# Patient Record
Sex: Male | Born: 2000 | Race: White | Hispanic: No | Marital: Single | Smoking: Never smoker
Health system: Southern US, Community
[De-identification: ages and names within clinical notes are randomized; demographics above are authoritative.]

---
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
## 2022-03-13 IMAGING — CR DG CHEST 2V
2 series · 2 of 2 positions shown · non-contrast
Comparison: None.

CLINICAL DATA: Chest pain

EXAM:
CHEST - 2 VIEW

[chest pa]
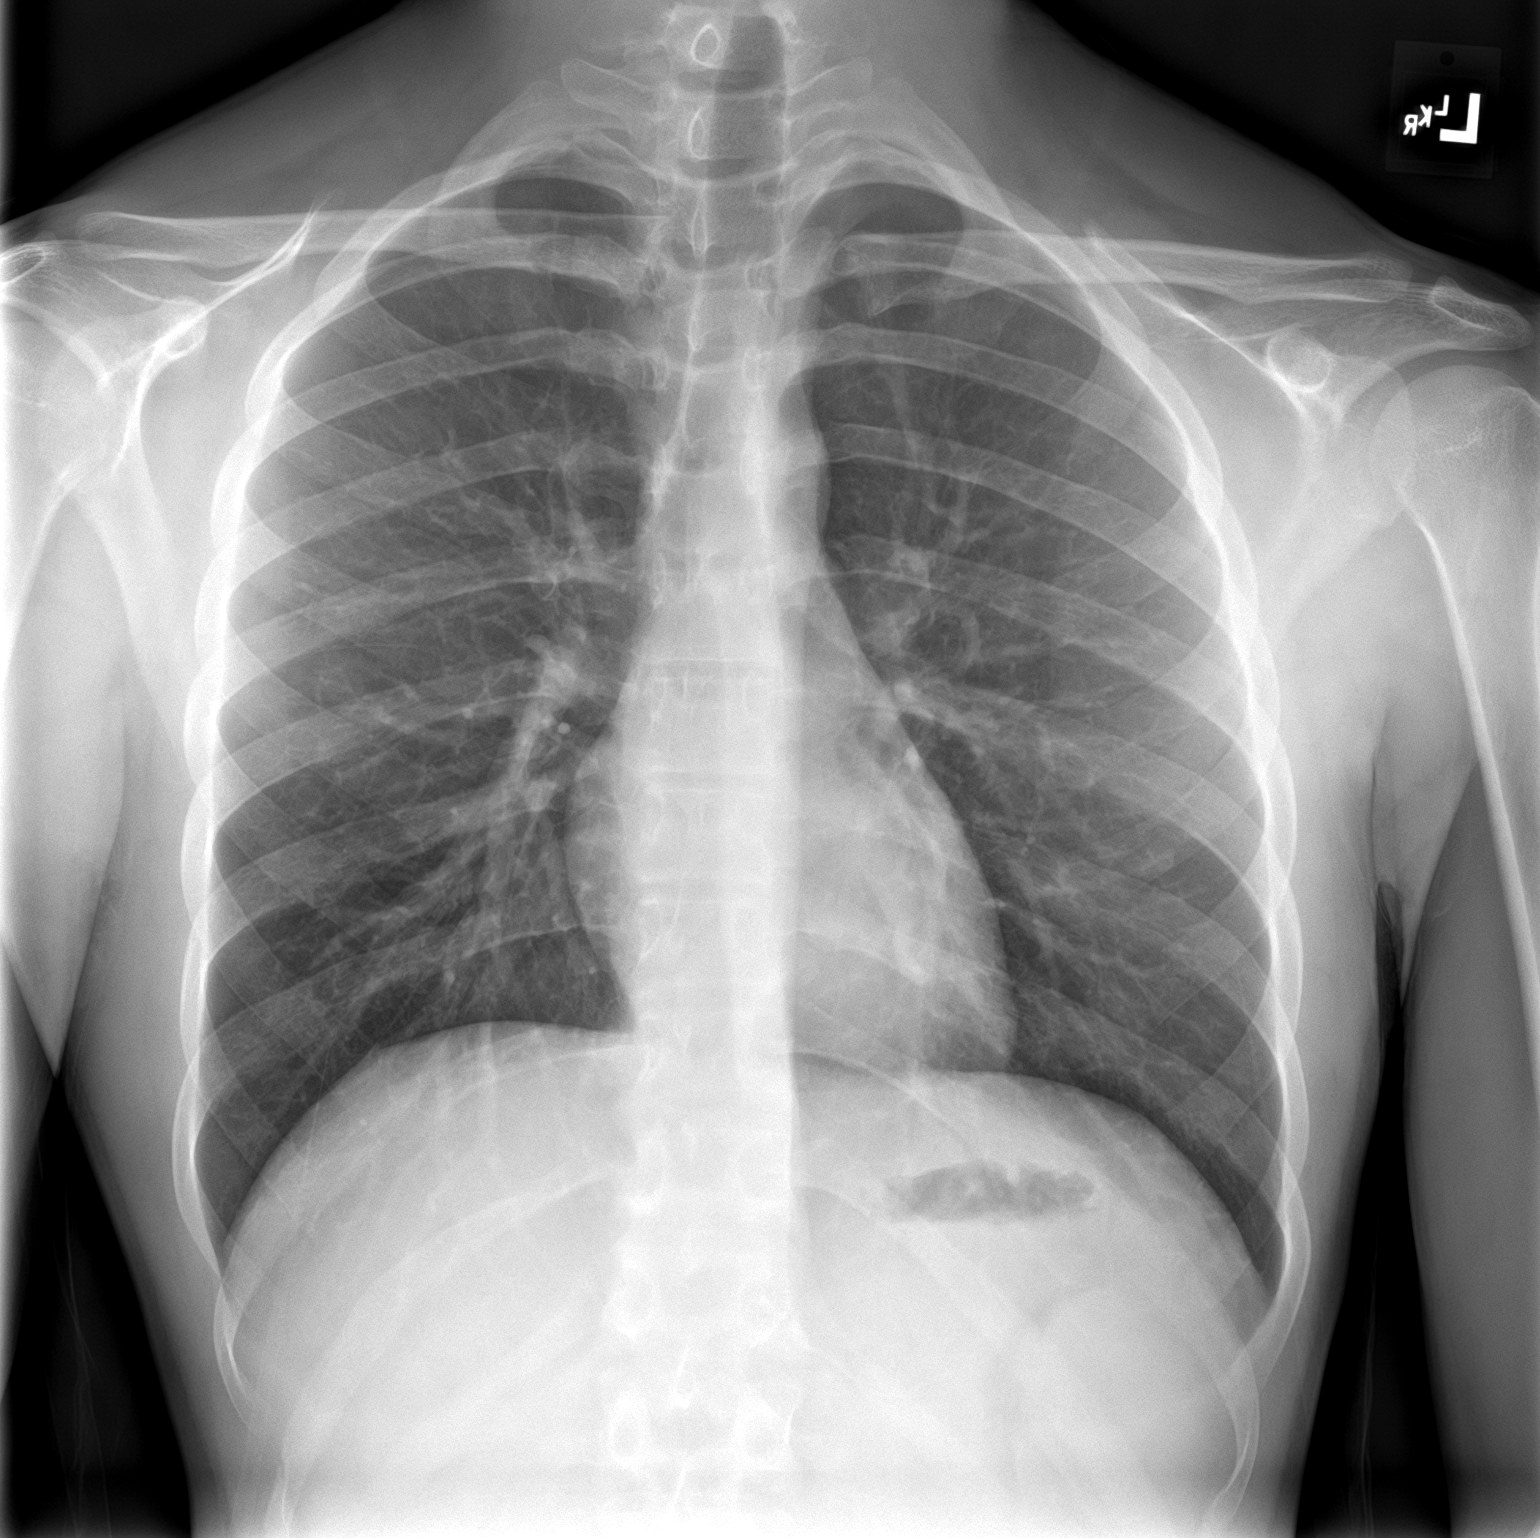

[chest lat]
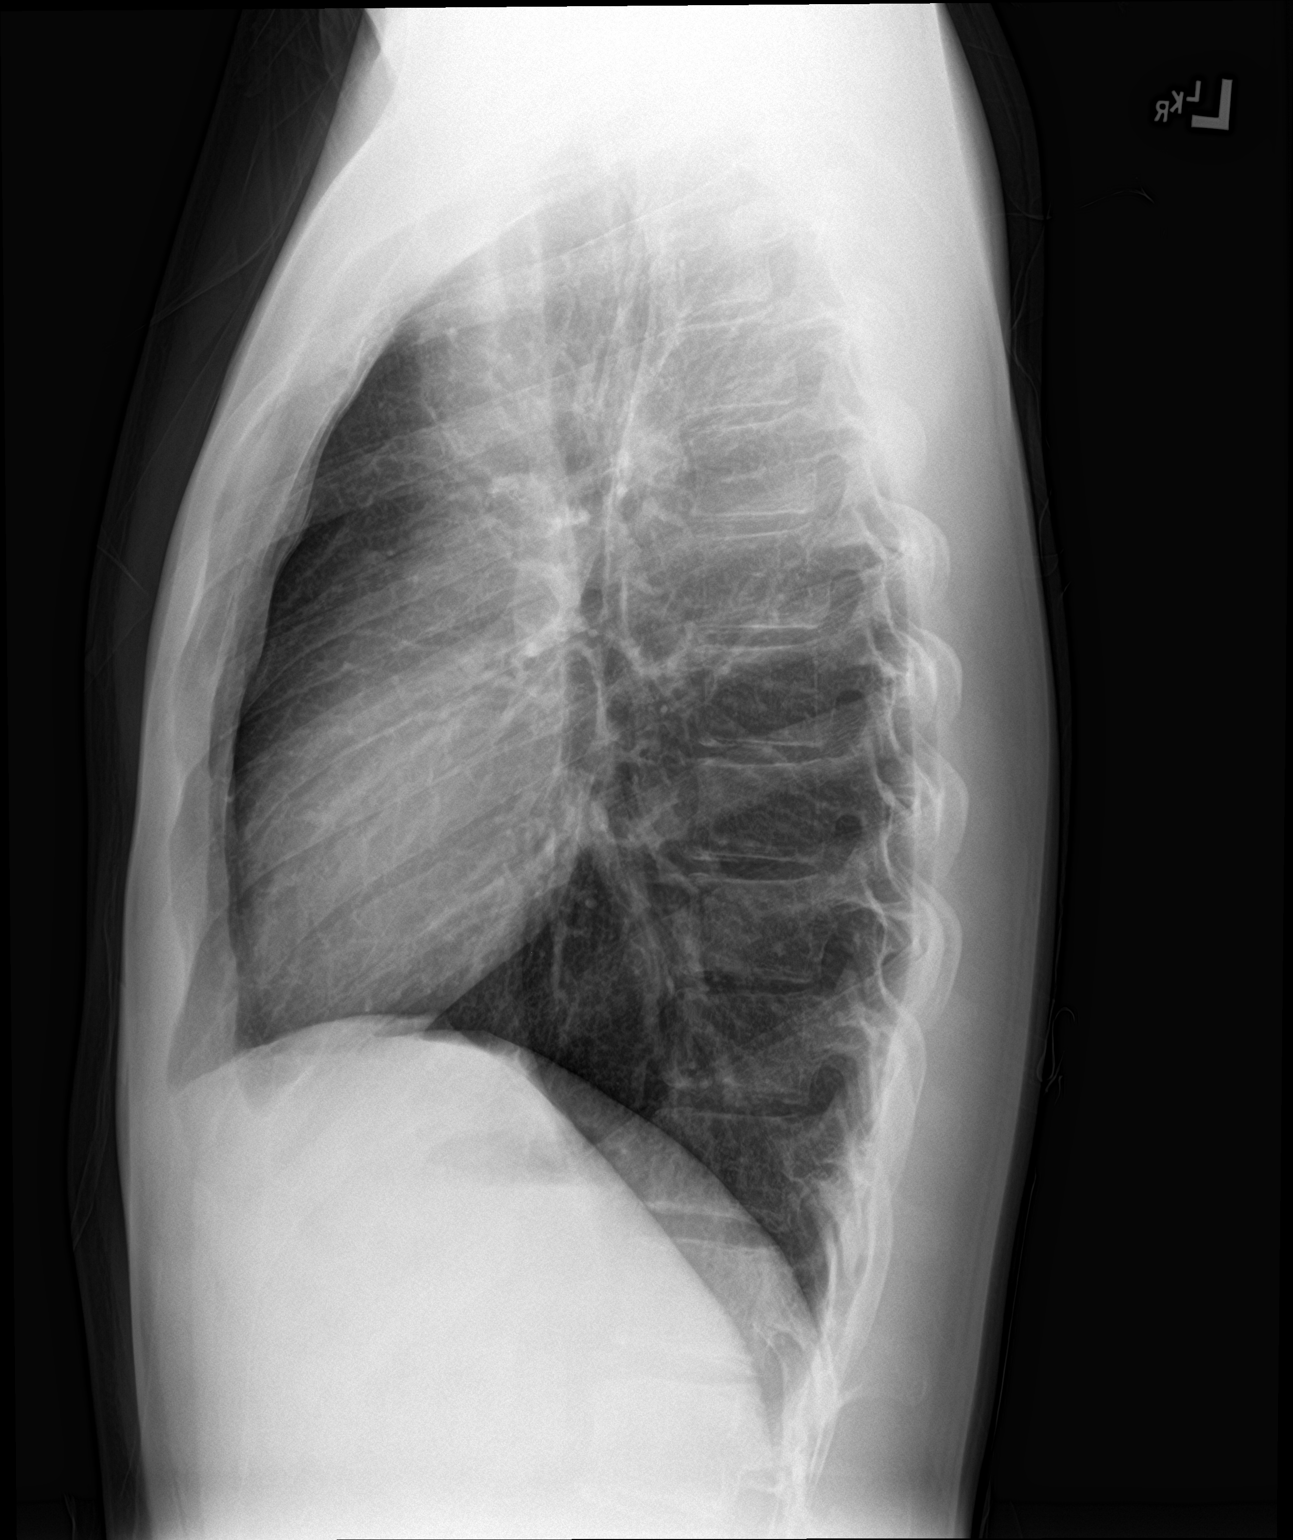

[2 of 2 positions shown; findings below may reference images not displayed]

FINDINGS: Lungs are clear. Heart size and pulmonary vascularity are normal. No
adenopathy. No bone lesions.
IMPRESSION: No abnormality noted.
